# Patient Record
Sex: Female | Born: 2000 | Race: Black or African American | Hispanic: No | Marital: Single
Health system: Southern US, Community
[De-identification: ages and names within clinical notes are randomized; demographics above are authoritative.]

---
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
## 2021-07-23 DIAGNOSIS — F4310 Post-traumatic stress disorder, unspecified: Secondary | ICD-10-CM | POA: Diagnosis not present

## 2021-09-25 DIAGNOSIS — Z789 Other specified health status: Secondary | ICD-10-CM | POA: Diagnosis not present

## 2021-09-26 DIAGNOSIS — Z789 Other specified health status: Secondary | ICD-10-CM | POA: Diagnosis not present

## 2022-02-21 DIAGNOSIS — J029 Acute pharyngitis, unspecified: Secondary | ICD-10-CM | POA: Diagnosis not present

## 2022-02-21 DIAGNOSIS — Z1152 Encounter for screening for COVID-19: Secondary | ICD-10-CM | POA: Diagnosis not present

## 2022-03-14 DIAGNOSIS — R102 Pelvic and perineal pain: Secondary | ICD-10-CM | POA: Diagnosis not present

## 2022-03-14 DIAGNOSIS — R109 Unspecified abdominal pain: Secondary | ICD-10-CM | POA: Diagnosis not present

## 2022-03-25 DIAGNOSIS — Z01419 Encounter for gynecological examination (general) (routine) without abnormal findings: Secondary | ICD-10-CM | POA: Diagnosis not present

## 2022-03-25 DIAGNOSIS — F640 Transsexualism: Secondary | ICD-10-CM | POA: Diagnosis not present

## 2022-03-25 DIAGNOSIS — R102 Pelvic and perineal pain: Secondary | ICD-10-CM | POA: Diagnosis not present

## 2022-04-18 IMAGING — DX DG CHEST 1V PORT
1 series · 1 of 1 positions shown · non-contrast
Comparison: None.

CLINICAL DATA: COVID.  Shortness of breath

EXAM:
PORTABLE CHEST 1 VIEW

[chest ap]
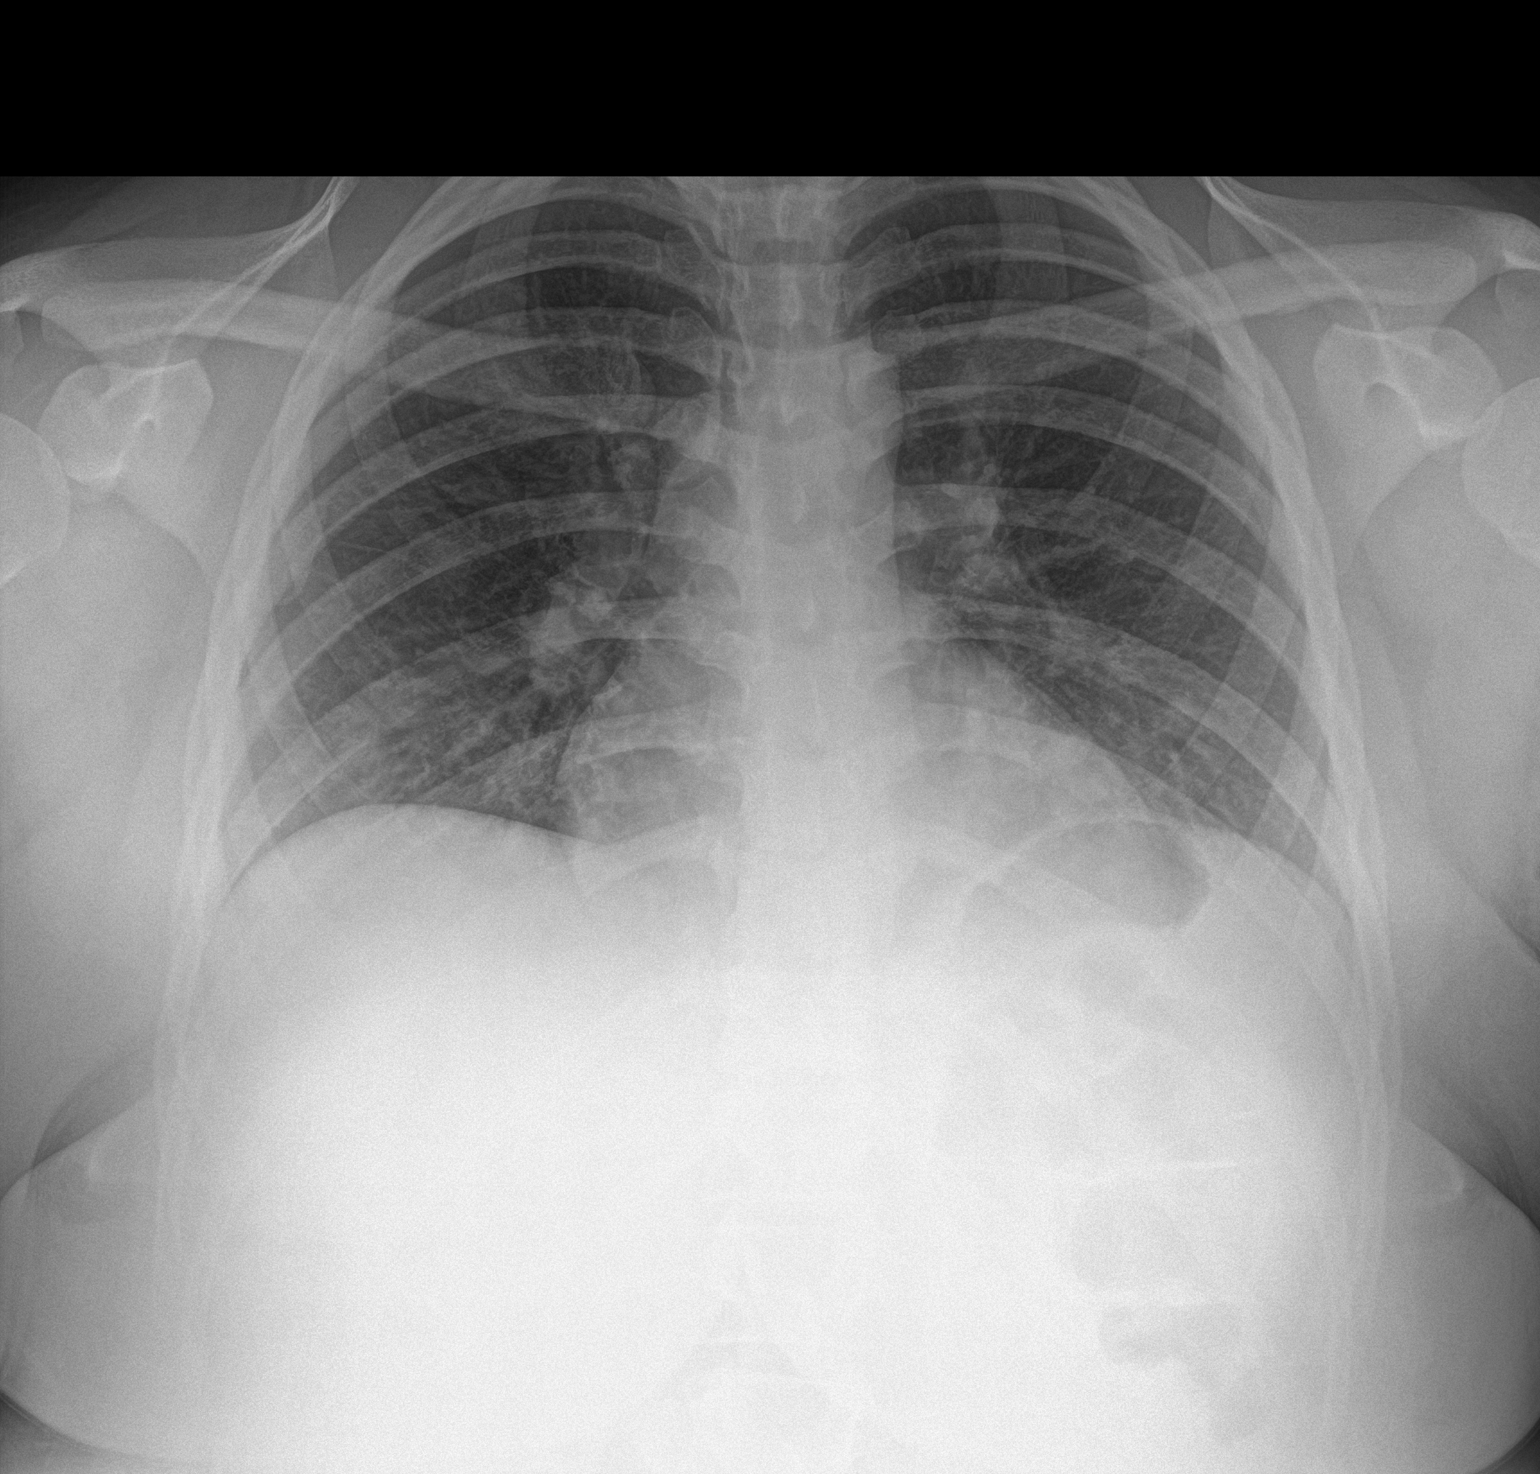

[1 of 1 positions shown; findings below may reference images not displayed]

FINDINGS: The heart size and mediastinal contours are within normal limits.
Bibasilar airspace opacities. No effusions. No pneumothorax. Low
lung volumes. The visualized skeletal structures are unremarkable.
IMPRESSION: Bibasilar airspace opacities, compatible with multifocal pneumonia.

## 2022-05-16 DIAGNOSIS — Z6841 Body Mass Index (BMI) 40.0 and over, adult: Secondary | ICD-10-CM | POA: Diagnosis not present

## 2022-05-16 DIAGNOSIS — R102 Pelvic and perineal pain: Secondary | ICD-10-CM | POA: Diagnosis not present

## 2022-05-16 DIAGNOSIS — F640 Transsexualism: Secondary | ICD-10-CM | POA: Diagnosis not present

## 2022-06-30 DIAGNOSIS — F649 Gender identity disorder, unspecified: Secondary | ICD-10-CM | POA: Diagnosis not present

## 2022-07-01 DIAGNOSIS — Z1331 Encounter for screening for depression: Secondary | ICD-10-CM | POA: Diagnosis not present

## 2022-07-01 DIAGNOSIS — E669 Obesity, unspecified: Secondary | ICD-10-CM | POA: Diagnosis not present

## 2022-07-01 DIAGNOSIS — Z6841 Body Mass Index (BMI) 40.0 and over, adult: Secondary | ICD-10-CM | POA: Diagnosis not present

## 2022-09-29 DIAGNOSIS — F649 Gender identity disorder, unspecified: Secondary | ICD-10-CM | POA: Diagnosis not present

## 2022-10-13 ENCOUNTER — Other Ambulatory Visit: Payer: Self-pay

## 2022-10-13 ENCOUNTER — Emergency Department (HOSPITAL_COMMUNITY)
Admission: EM | Admit: 2022-10-13 | Discharge: 2022-10-13 | Disposition: A | Discharge status: Home / Self Care | Payer: BC Managed Care – PPO | Attending: Emergency Medicine | Admitting: Emergency Medicine

## 2022-10-13 ENCOUNTER — Emergency Department (HOSPITAL_COMMUNITY): Payer: BC Managed Care – PPO

## 2022-10-13 ENCOUNTER — Encounter (HOSPITAL_COMMUNITY): Payer: Self-pay

## 2022-10-13 DIAGNOSIS — R000 Tachycardia, unspecified: Secondary | ICD-10-CM | POA: Insufficient documentation

## 2022-10-13 DIAGNOSIS — W19XXXA Unspecified fall, initial encounter: Secondary | ICD-10-CM | POA: Diagnosis not present

## 2022-10-13 DIAGNOSIS — Y92828 Other wilderness area as the place of occurrence of the external cause: Secondary | ICD-10-CM | POA: Diagnosis not present

## 2022-10-13 DIAGNOSIS — R55 Syncope and collapse: Secondary | ICD-10-CM

## 2022-10-13 DIAGNOSIS — R109 Unspecified abdominal pain: Secondary | ICD-10-CM | POA: Diagnosis not present

## 2022-10-13 DIAGNOSIS — S0990XA Unspecified injury of head, initial encounter: Secondary | ICD-10-CM | POA: Diagnosis not present

## 2022-10-13 DIAGNOSIS — Y9319 Activity, other involving water and watercraft: Secondary | ICD-10-CM | POA: Insufficient documentation

## 2022-10-13 DIAGNOSIS — R0789 Other chest pain: Secondary | ICD-10-CM | POA: Insufficient documentation

## 2022-10-13 LAB — HEPATIC FUNCTION PANEL
ALT: 19 U/L (ref 0–44)
AST: 20 U/L (ref 15–41)
Albumin: 4.1 g/dL (ref 3.5–5.0)
Alkaline Phosphatase: 53 U/L (ref 38–126)
Bilirubin, Direct: <0.1 mg/dL (ref 0.0–0.2)
Total Bilirubin: 0.3 mg/dL (ref 0.3–1.2)
Total Protein: 7.1 g/dL (ref 6.5–8.1)

## 2022-10-13 LAB — BASIC METABOLIC PANEL
Anion gap: 8 (ref 5–15)
BUN: 16 mg/dL (ref 6–20)
CO2: 25 mmol/L (ref 22–32)
Calcium: 9.2 mg/dL (ref 8.9–10.3)
Chloride: 103 mmol/L (ref 98–111)
Creatinine, Ser: 0.85 mg/dL (ref 0.44–1.00)
GFR, Estimated: >60 mL/min (ref >60)
Glucose, Bld: 134 mg/dL — ABNORMAL HIGH (ref 70–99)
Potassium: 4.3 mmol/L (ref 3.5–5.1)
Sodium: 136 mmol/L (ref 135–145)

## 2022-10-13 LAB — URINALYSIS, ROUTINE W REFLEX MICROSCOPIC
Bilirubin Urine: NEGATIVE
Glucose, UA: NEGATIVE mg/dL
Hgb urine dipstick: NEGATIVE
Ketones, ur: NEGATIVE mg/dL
Leukocytes,Ua: NEGATIVE
Nitrite: NEGATIVE
Protein, ur: 30 mg/dL — AB
Specific Gravity, Urine: 1.027 (ref 1.005–1.030)
pH: 5 (ref 5.0–8.0)

## 2022-10-13 LAB — CBC
HCT: 38.7 % (ref 36.0–46.0)
Hemoglobin: 13.1 g/dL (ref 12.0–15.0)
MCH: 29.6 pg (ref 26.0–34.0)
MCHC: 33.9 g/dL (ref 30.0–36.0)
MCV: 87.6 fL (ref 80.0–100.0)
Platelets: 337 10*3/uL (ref 150–400)
RBC: 4.42 MIL/uL (ref 3.87–5.11)
RDW: 13 % (ref 11.5–15.5)
WBC: 8.1 10*3/uL (ref 4.0–10.5)
nRBC: 0 % (ref 0.0–0.2)

## 2022-10-13 LAB — LIPASE, BLOOD: Lipase: 25 U/L (ref 11–51)

## 2022-10-13 LAB — I-STAT BETA HCG BLOOD, ED (MC, WL, AP ONLY): I-stat hCG, quantitative: <5 m[IU]/mL (ref <5)

## 2022-10-13 LAB — CBG MONITORING, ED: Glucose-Capillary: 127 mg/dL — ABNORMAL HIGH (ref 70–99)

## 2022-10-13 LAB — TROPONIN I (HIGH SENSITIVITY): Troponin I (High Sensitivity): 3 ng/L (ref <18)

## 2022-10-13 LAB — D-DIMER, QUANTITATIVE: D-Dimer, Quant: <0.27 ug/mL-FEU (ref 0.00–0.50)

## 2022-10-13 MED ORDER — ACETAMINOPHEN 500 MG PO TABS
1000.0000 mg | ORAL_TABLET | Freq: Once | ORAL | Status: AC
  Administered 2022-10-13: 1000 mg via ORAL
  Filled 2022-10-13: qty 2

## 2022-10-13 NOTE — ED Provider Notes (Signed)
[place] EMERGENCY DEPARTMENT AT [facility] Provider Note   CSN: [id number] Arrival date & time: 10/13/22  1815     History {Add pertinent medical, surgical, social history, OB history to HPI:1} Chief Complaint  Patient presents with  . Chest Pain  . Abdominal Pain  . Syncopal Event    Andrea Roberts is a 22 y.o. female.  Patient presents after syncopal episode.  Patient had abdominal cramping with nausea shortly after lunch eating       Home Medications Prior to Admission medications   Medication Sig Start Date End Date Taking? Authorizing Provider  benzonatate (TESSALON) 100 MG capsule Take 1 capsule (100 mg total) by mouth every 8 (eight) hours. 12/29/19   Petrucelli, Samantha R, PA-C  naproxen (NAPROSYN) 500 MG tablet Take 1 tablet (500 mg total) by mouth 2 (two) times daily as needed for moderate pain. 12/29/19   Petrucelli, Samantha R, PA-C  ondansetron (ZOFRAN ODT) 4 MG disintegrating tablet Take 1 tablet (4 mg total) by mouth every 8 (eight) hours as needed for nausea or vomiting. 12/29/19   Petrucelli, Pleas Koch, PA-C      Allergies    Rocephin [ceftriaxone sodium in dextrose] and Sulfa antibiotics    Review of Systems   Review of Systems  Physical Exam Updated Vital Signs BP 108/65   Pulse 82   Temp 98.9 F (37.2 C)   Resp 20   SpO2 97%  Physical Exam Vitals and nursing note reviewed.  Constitutional:      General: She is not in acute distress.    Appearance: She is well-developed.  HENT:     Head: Normocephalic and atraumatic.     Mouth/Throat:     Mouth: Mucous membranes are moist.  Eyes:     General:        Right eye: No discharge.        Left eye: No discharge.     Conjunctiva/sclera: Conjunctivae normal.  Neck:     Trachea: No tracheal deviation.  Cardiovascular:     Rate and Rhythm: Normal rate and regular rhythm.     Heart sounds: No murmur heard. Pulmonary:     Effort: Pulmonary effort is normal.     Breath sounds: Normal  breath sounds.  Abdominal:     General: There is no distension.     Palpations: Abdomen is soft.     Tenderness: There is no abdominal tenderness. There is no guarding.  Musculoskeletal:     Cervical back: Normal range of motion and neck supple. No rigidity.  Skin:    General: Skin is warm.     Capillary Refill: Capillary refill takes less than 2 seconds.     Findings: No rash.  Neurological:     General: No focal deficit present.     Mental Status: She is alert.     Cranial Nerves: No cranial nerve deficit.  Psychiatric:        Mood and Affect: Mood normal.    ED Results / Procedures / Treatments   Labs (all labs ordered are listed, but only abnormal results are displayed) Labs Reviewed  BASIC METABOLIC PANEL - Abnormal; Notable for the following components:      Result Value   Glucose, Bld 134 (*)    All other components within normal limits  URINALYSIS, ROUTINE W REFLEX MICROSCOPIC - Abnormal; Notable for the following components:   APPearance HAZY (*)    Protein, ur 30 (*)    Bacteria,  UA RARE (*)    All other components within normal limits  CBG MONITORING, ED - Abnormal; Notable for the following components:   Glucose-Capillary 127 (*)    All other components within normal limits  CBC  D-DIMER, QUANTITATIVE  LIPASE, BLOOD  HEPATIC FUNCTION PANEL  I-STAT BETA HCG BLOOD, ED ([facility], [facility], [facility] ONLY)  TROPONIN I (HIGH SENSITIVITY)    EKG None  Radiology CT HEAD WO CONTRAST  Result Date: 10/13/2022 CLINICAL DATA:  Recent syncopal event EXAM: CT HEAD WITHOUT CONTRAST TECHNIQUE: Contiguous axial images were obtained from the base of the skull through the vertex without intravenous contrast. RADIATION DOSE REDUCTION: This exam was performed according to the departmental dose-optimization program which includes automated exposure control, adjustment of the mA and/or kV according to patient size and/or use of iterative reconstruction technique. COMPARISON:  None Available.  FINDINGS: Brain: No evidence of acute infarction, hemorrhage, hydrocephalus, extra-axial collection or mass lesion/mass effect. Vascular: No hyperdense vessel or unexpected calcification. Skull: Normal. Negative for fracture or focal lesion. Sinuses/Orbits: No acute finding. Other: None. IMPRESSION: No acute intracranial abnormality noted. Electronically Signed   By: Alcide Clever M.D.   On: 10/13/2022 19:14    Procedures Procedures  {Document cardiac monitor, telemetry assessment procedure when appropriate:1}  Medications Ordered in ED Medications  acetaminophen (TYLENOL) tablet 1,000 mg (1,000 mg Oral Given 10/13/22 2124)    ED Course/ Medical Decision Making/ A&P   {   Click here for ABCD2, HEART and other calculatorsREFRESH Note before signing :1}                          Medical Decision Making Amount and/or Complexity of Data Reviewed Labs: ordered.  Risk OTC drugs.   ***  {Document critical care time when appropriate:1} {Document review of labs and clinical decision tools ie heart score, Chads2Vasc2 etc:1}  {Document your independent review of radiology images, and any outside records:1} {Document your discussion with family members, caretakers, and with consultants:1} {Document social determinants of health affecting pt's care:1} {Document your decision making why or why not admission, treatments were needed:1} Final Clinical Impression(s) / ED Diagnoses Final diagnoses:  Vasovagal syncope  Acute head injury, initial encounter    Rx / DC Orders ED Discharge Orders     None

## 2022-10-13 NOTE — Discharge Instructions (Addendum)
Your heart test troponin, blood clotting test D-dimer were normal. Follow-up with your primary doctor for further coordination of your care with concern for mild concussion, pain and passing out.  You may need to be referred to cardiology depending on their recommendation. Use Tylenol every 4 hours as needed for pain.

## 2022-10-13 NOTE — ED Provider Triage Note (Signed)
Emergency Medicine Provider Triage Evaluation Note  Andrea Roberts , a 22 y.o. female  was evaluated in triage.  Pt complains of syncope. Report after lunch she had abdominal cramping with nausea.  Went outside fishing for about 45 min when she felt flushed, and syncopized, hitting head.  No cp or sob.  Doesn't think it was too hot outside or prolonged time outside.  Currently on hormone medication.  No hx of PE/DVT.    Review of Systems  Positive: As above Negative: As above  Physical Exam  BP 127/65   Pulse (!) 108   Temp 98.9 F (37.2 C)   Resp 18   SpO2 100%  Gen:   Awake, no distress   Resp:  Normal effort  MSK:   Moves extremities without difficulty  Other:    Medical Decision Making  Medically screening exam initiated at 6:25 PM.  Appropriate orders placed.  VYVY JUSTIN was informed that the remainder of the evaluation will be completed by another provider, this initial triage assessment does not replace that evaluation, and the importance of remaining in the ED until their evaluation is complete.     Fayrene Helper, PA-C 10/13/22 1831

## 2022-10-13 NOTE — ED Triage Notes (Signed)
[place] & began feeling nauseous & having abd pain. [place] she reports falling & having LOC & hitting her head. Also reports abd pain radiates up into her chest & rates pain 5/10 while in triage.

## 2022-10-23 DIAGNOSIS — K219 Gastro-esophageal reflux disease without esophagitis: Secondary | ICD-10-CM | POA: Diagnosis not present

## 2022-10-23 DIAGNOSIS — R0602 Shortness of breath: Secondary | ICD-10-CM | POA: Diagnosis not present

## 2022-10-23 DIAGNOSIS — Z6841 Body Mass Index (BMI) 40.0 and over, adult: Secondary | ICD-10-CM | POA: Diagnosis not present

## 2022-12-22 DIAGNOSIS — Z1331 Encounter for screening for depression: Secondary | ICD-10-CM | POA: Diagnosis not present

## 2022-12-22 DIAGNOSIS — R0602 Shortness of breath: Secondary | ICD-10-CM | POA: Diagnosis not present

## 2022-12-22 DIAGNOSIS — R635 Abnormal weight gain: Secondary | ICD-10-CM | POA: Diagnosis not present

## 2022-12-22 DIAGNOSIS — Z131 Encounter for screening for diabetes mellitus: Secondary | ICD-10-CM | POA: Diagnosis not present

## 2022-12-22 DIAGNOSIS — Z13220 Encounter for screening for lipoid disorders: Secondary | ICD-10-CM | POA: Diagnosis not present

## 2022-12-22 DIAGNOSIS — Z6841 Body Mass Index (BMI) 40.0 and over, adult: Secondary | ICD-10-CM | POA: Diagnosis not present

## 2022-12-22 DIAGNOSIS — Z9189 Other specified personal risk factors, not elsewhere classified: Secondary | ICD-10-CM | POA: Diagnosis not present

## 2022-12-23 DIAGNOSIS — F411 Generalized anxiety disorder: Secondary | ICD-10-CM | POA: Diagnosis not present

## 2022-12-30 DIAGNOSIS — F411 Generalized anxiety disorder: Secondary | ICD-10-CM | POA: Diagnosis not present

## 2022-12-30 DIAGNOSIS — F649 Gender identity disorder, unspecified: Secondary | ICD-10-CM | POA: Diagnosis not present

## 2023-01-05 DIAGNOSIS — E559 Vitamin D deficiency, unspecified: Secondary | ICD-10-CM | POA: Diagnosis not present

## 2023-01-05 DIAGNOSIS — R635 Abnormal weight gain: Secondary | ICD-10-CM | POA: Diagnosis not present

## 2023-01-05 DIAGNOSIS — E88819 Insulin resistance, unspecified: Secondary | ICD-10-CM | POA: Diagnosis not present

## 2023-01-05 DIAGNOSIS — R0602 Shortness of breath: Secondary | ICD-10-CM | POA: Diagnosis not present

## 2023-01-06 DIAGNOSIS — F411 Generalized anxiety disorder: Secondary | ICD-10-CM | POA: Diagnosis not present

## 2023-01-20 DIAGNOSIS — F411 Generalized anxiety disorder: Secondary | ICD-10-CM | POA: Diagnosis not present

## 2023-01-22 DIAGNOSIS — R0602 Shortness of breath: Secondary | ICD-10-CM | POA: Diagnosis not present

## 2023-01-22 DIAGNOSIS — Z6841 Body Mass Index (BMI) 40.0 and over, adult: Secondary | ICD-10-CM | POA: Diagnosis not present

## 2023-01-22 DIAGNOSIS — E88819 Insulin resistance, unspecified: Secondary | ICD-10-CM | POA: Diagnosis not present

## 2023-01-22 DIAGNOSIS — E559 Vitamin D deficiency, unspecified: Secondary | ICD-10-CM | POA: Diagnosis not present

## 2023-01-27 DIAGNOSIS — F411 Generalized anxiety disorder: Secondary | ICD-10-CM | POA: Diagnosis not present

## 2023-02-03 DIAGNOSIS — F411 Generalized anxiety disorder: Secondary | ICD-10-CM | POA: Diagnosis not present

## 2023-02-09 DIAGNOSIS — J069 Acute upper respiratory infection, unspecified: Secondary | ICD-10-CM | POA: Diagnosis not present

## 2023-02-09 DIAGNOSIS — Z1152 Encounter for screening for COVID-19: Secondary | ICD-10-CM | POA: Diagnosis not present

## 2023-02-10 DIAGNOSIS — F411 Generalized anxiety disorder: Secondary | ICD-10-CM | POA: Diagnosis not present

## 2023-02-17 DIAGNOSIS — F411 Generalized anxiety disorder: Secondary | ICD-10-CM | POA: Diagnosis not present

## 2023-02-26 DIAGNOSIS — R0602 Shortness of breath: Secondary | ICD-10-CM | POA: Diagnosis not present

## 2023-02-26 DIAGNOSIS — E559 Vitamin D deficiency, unspecified: Secondary | ICD-10-CM | POA: Diagnosis not present

## 2023-02-26 DIAGNOSIS — E88819 Insulin resistance, unspecified: Secondary | ICD-10-CM | POA: Diagnosis not present

## 2023-03-10 DIAGNOSIS — F411 Generalized anxiety disorder: Secondary | ICD-10-CM | POA: Diagnosis not present

## 2023-03-17 DIAGNOSIS — F411 Generalized anxiety disorder: Secondary | ICD-10-CM | POA: Diagnosis not present

## 2023-03-20 DIAGNOSIS — K625 Hemorrhage of anus and rectum: Secondary | ICD-10-CM | POA: Diagnosis not present

## 2023-03-24 DIAGNOSIS — F411 Generalized anxiety disorder: Secondary | ICD-10-CM | POA: Diagnosis not present

## 2023-03-31 DIAGNOSIS — F411 Generalized anxiety disorder: Secondary | ICD-10-CM | POA: Diagnosis not present

## 2023-04-01 DIAGNOSIS — R0602 Shortness of breath: Secondary | ICD-10-CM | POA: Diagnosis not present

## 2023-04-01 DIAGNOSIS — R635 Abnormal weight gain: Secondary | ICD-10-CM | POA: Diagnosis not present

## 2023-04-01 DIAGNOSIS — E66812 Obesity, class 2: Secondary | ICD-10-CM | POA: Diagnosis not present

## 2023-04-01 DIAGNOSIS — Z6839 Body mass index (BMI) 39.0-39.9, adult: Secondary | ICD-10-CM | POA: Diagnosis not present

## 2023-04-01 DIAGNOSIS — Z79899 Other long term (current) drug therapy: Secondary | ICD-10-CM | POA: Diagnosis not present

## 2023-04-01 DIAGNOSIS — E88819 Insulin resistance, unspecified: Secondary | ICD-10-CM | POA: Diagnosis not present

## 2023-04-01 DIAGNOSIS — E559 Vitamin D deficiency, unspecified: Secondary | ICD-10-CM | POA: Diagnosis not present

## 2023-04-10 DIAGNOSIS — F649 Gender identity disorder, unspecified: Secondary | ICD-10-CM | POA: Diagnosis not present

## 2023-04-13 DIAGNOSIS — R1084 Generalized abdominal pain: Secondary | ICD-10-CM | POA: Diagnosis not present

## 2023-04-13 DIAGNOSIS — K625 Hemorrhage of anus and rectum: Secondary | ICD-10-CM | POA: Diagnosis not present

## 2023-04-14 DIAGNOSIS — K625 Hemorrhage of anus and rectum: Secondary | ICD-10-CM | POA: Diagnosis not present

## 2023-04-14 DIAGNOSIS — F411 Generalized anxiety disorder: Secondary | ICD-10-CM | POA: Diagnosis not present

## 2023-04-20 DIAGNOSIS — F411 Generalized anxiety disorder: Secondary | ICD-10-CM | POA: Diagnosis not present

## 2023-05-05 DIAGNOSIS — F411 Generalized anxiety disorder: Secondary | ICD-10-CM | POA: Diagnosis not present

## 2023-05-12 DIAGNOSIS — F411 Generalized anxiety disorder: Secondary | ICD-10-CM | POA: Diagnosis not present

## 2023-05-19 DIAGNOSIS — F411 Generalized anxiety disorder: Secondary | ICD-10-CM | POA: Diagnosis not present

## 2023-05-26 DIAGNOSIS — F411 Generalized anxiety disorder: Secondary | ICD-10-CM | POA: Diagnosis not present

## 2023-05-27 DIAGNOSIS — Z79899 Other long term (current) drug therapy: Secondary | ICD-10-CM | POA: Diagnosis not present

## 2023-05-27 DIAGNOSIS — Z6837 Body mass index (BMI) 37.0-37.9, adult: Secondary | ICD-10-CM | POA: Diagnosis not present

## 2023-05-27 DIAGNOSIS — E88819 Insulin resistance, unspecified: Secondary | ICD-10-CM | POA: Diagnosis not present

## 2023-05-27 DIAGNOSIS — E559 Vitamin D deficiency, unspecified: Secondary | ICD-10-CM | POA: Diagnosis not present

## 2023-06-02 DIAGNOSIS — F411 Generalized anxiety disorder: Secondary | ICD-10-CM | POA: Diagnosis not present

## 2023-06-09 DIAGNOSIS — F411 Generalized anxiety disorder: Secondary | ICD-10-CM | POA: Diagnosis not present

## 2023-06-16 DIAGNOSIS — F411 Generalized anxiety disorder: Secondary | ICD-10-CM | POA: Diagnosis not present

## 2023-06-23 DIAGNOSIS — F411 Generalized anxiety disorder: Secondary | ICD-10-CM | POA: Diagnosis not present

## 2023-06-25 DIAGNOSIS — E88819 Insulin resistance, unspecified: Secondary | ICD-10-CM | POA: Diagnosis not present

## 2023-06-25 DIAGNOSIS — E559 Vitamin D deficiency, unspecified: Secondary | ICD-10-CM | POA: Diagnosis not present

## 2023-07-02 DIAGNOSIS — F411 Generalized anxiety disorder: Secondary | ICD-10-CM | POA: Diagnosis not present

## 2023-07-08 DIAGNOSIS — F649 Gender identity disorder, unspecified: Secondary | ICD-10-CM | POA: Diagnosis not present

## 2023-07-09 DIAGNOSIS — F411 Generalized anxiety disorder: Secondary | ICD-10-CM | POA: Diagnosis not present

## 2023-07-14 DIAGNOSIS — F411 Generalized anxiety disorder: Secondary | ICD-10-CM | POA: Diagnosis not present

## 2023-07-16 DIAGNOSIS — K625 Hemorrhage of anus and rectum: Secondary | ICD-10-CM | POA: Diagnosis not present

## 2023-07-16 DIAGNOSIS — K6389 Other specified diseases of intestine: Secondary | ICD-10-CM | POA: Diagnosis not present

## 2023-07-20 DIAGNOSIS — Z23 Encounter for immunization: Secondary | ICD-10-CM | POA: Diagnosis not present

## 2023-07-21 DIAGNOSIS — F411 Generalized anxiety disorder: Secondary | ICD-10-CM | POA: Diagnosis not present

## 2023-08-04 DIAGNOSIS — E559 Vitamin D deficiency, unspecified: Secondary | ICD-10-CM | POA: Diagnosis not present

## 2023-08-04 DIAGNOSIS — E66812 Obesity, class 2: Secondary | ICD-10-CM | POA: Diagnosis not present

## 2023-08-04 DIAGNOSIS — Z6835 Body mass index (BMI) 35.0-35.9, adult: Secondary | ICD-10-CM | POA: Diagnosis not present

## 2023-08-04 DIAGNOSIS — E88819 Insulin resistance, unspecified: Secondary | ICD-10-CM | POA: Diagnosis not present

## 2023-08-04 DIAGNOSIS — F411 Generalized anxiety disorder: Secondary | ICD-10-CM | POA: Diagnosis not present

## 2023-08-18 DIAGNOSIS — F411 Generalized anxiety disorder: Secondary | ICD-10-CM | POA: Diagnosis not present

## 2023-09-01 DIAGNOSIS — F411 Generalized anxiety disorder: Secondary | ICD-10-CM | POA: Diagnosis not present

## 2023-09-04 DIAGNOSIS — E6609 Other obesity due to excess calories: Secondary | ICD-10-CM | POA: Diagnosis not present

## 2023-09-08 DIAGNOSIS — F411 Generalized anxiety disorder: Secondary | ICD-10-CM | POA: Diagnosis not present

## 2023-09-16 DIAGNOSIS — F411 Generalized anxiety disorder: Secondary | ICD-10-CM | POA: Diagnosis not present

## 2023-09-18 DIAGNOSIS — M25562 Pain in left knee: Secondary | ICD-10-CM | POA: Diagnosis not present

## 2023-09-18 DIAGNOSIS — M25561 Pain in right knee: Secondary | ICD-10-CM | POA: Diagnosis not present

## 2023-09-22 DIAGNOSIS — F411 Generalized anxiety disorder: Secondary | ICD-10-CM | POA: Diagnosis not present

## 2023-09-30 DIAGNOSIS — F411 Generalized anxiety disorder: Secondary | ICD-10-CM | POA: Diagnosis not present

## 2023-10-06 DIAGNOSIS — F411 Generalized anxiety disorder: Secondary | ICD-10-CM | POA: Diagnosis not present

## 2023-10-06 DIAGNOSIS — F649 Gender identity disorder, unspecified: Secondary | ICD-10-CM | POA: Diagnosis not present

## 2023-10-13 DIAGNOSIS — F411 Generalized anxiety disorder: Secondary | ICD-10-CM | POA: Diagnosis not present

## 2023-10-20 DIAGNOSIS — F411 Generalized anxiety disorder: Secondary | ICD-10-CM | POA: Diagnosis not present

## 2023-10-27 DIAGNOSIS — F411 Generalized anxiety disorder: Secondary | ICD-10-CM | POA: Diagnosis not present

## 2023-11-10 DIAGNOSIS — F411 Generalized anxiety disorder: Secondary | ICD-10-CM | POA: Diagnosis not present

## 2023-11-11 DIAGNOSIS — S6992XA Unspecified injury of left wrist, hand and finger(s), initial encounter: Secondary | ICD-10-CM | POA: Diagnosis not present

## 2023-11-11 DIAGNOSIS — W5651XA Bitten by other fish, initial encounter: Secondary | ICD-10-CM | POA: Diagnosis not present

## 2023-11-11 DIAGNOSIS — S61431A Puncture wound without foreign body of right hand, initial encounter: Secondary | ICD-10-CM | POA: Diagnosis not present

## 2023-11-24 DIAGNOSIS — F411 Generalized anxiety disorder: Secondary | ICD-10-CM | POA: Diagnosis not present

## 2023-11-30 DIAGNOSIS — M79642 Pain in left hand: Secondary | ICD-10-CM | POA: Diagnosis not present

## 2023-12-08 DIAGNOSIS — F411 Generalized anxiety disorder: Secondary | ICD-10-CM | POA: Diagnosis not present

## 2023-12-15 DIAGNOSIS — F411 Generalized anxiety disorder: Secondary | ICD-10-CM | POA: Diagnosis not present

## 2023-12-22 DIAGNOSIS — F411 Generalized anxiety disorder: Secondary | ICD-10-CM | POA: Diagnosis not present

## 2023-12-29 DIAGNOSIS — F411 Generalized anxiety disorder: Secondary | ICD-10-CM | POA: Diagnosis not present

## 2024-01-05 DIAGNOSIS — F411 Generalized anxiety disorder: Secondary | ICD-10-CM | POA: Diagnosis not present

## 2024-01-12 DIAGNOSIS — F411 Generalized anxiety disorder: Secondary | ICD-10-CM | POA: Diagnosis not present

## 2024-01-20 DIAGNOSIS — F411 Generalized anxiety disorder: Secondary | ICD-10-CM | POA: Diagnosis not present

## 2024-01-26 DIAGNOSIS — F411 Generalized anxiety disorder: Secondary | ICD-10-CM | POA: Diagnosis not present

## 2024-03-15 DIAGNOSIS — F411 Generalized anxiety disorder: Secondary | ICD-10-CM | POA: Diagnosis not present

## 2024-03-18 DIAGNOSIS — R079 Chest pain, unspecified: Secondary | ICD-10-CM | POA: Diagnosis not present

## 2024-03-18 DIAGNOSIS — R0602 Shortness of breath: Secondary | ICD-10-CM | POA: Diagnosis not present

## 2024-03-18 DIAGNOSIS — Z1330 Encounter for screening examination for mental health and behavioral disorders, unspecified: Secondary | ICD-10-CM | POA: Diagnosis not present

## 2024-03-18 DIAGNOSIS — R42 Dizziness and giddiness: Secondary | ICD-10-CM | POA: Diagnosis not present

## 2024-03-29 DIAGNOSIS — F411 Generalized anxiety disorder: Secondary | ICD-10-CM | POA: Diagnosis not present

## 2024-04-05 DIAGNOSIS — F411 Generalized anxiety disorder: Secondary | ICD-10-CM | POA: Diagnosis not present
